# Patient Record
Sex: Female | Born: 1960 | Race: White | Hispanic: No | Smoking: Current every day smoker
Health system: Southern US, Community
[De-identification: ages and names within clinical notes are randomized; demographics above are authoritative.]

## PROBLEM LIST (undated history)

## (undated) DIAGNOSIS — I10 Essential (primary) hypertension: Secondary | ICD-10-CM

## (undated) DIAGNOSIS — E119 Type 2 diabetes mellitus without complications: Secondary | ICD-10-CM

## (undated) DIAGNOSIS — K219 Gastro-esophageal reflux disease without esophagitis: Secondary | ICD-10-CM

## (undated) DIAGNOSIS — E079 Disorder of thyroid, unspecified: Secondary | ICD-10-CM

## (undated) HISTORY — DX: Gastro-esophageal reflux disease without esophagitis: K21.9

## (undated) HISTORY — DX: Essential (primary) hypertension: I10

## (undated) HISTORY — PX: TUBAL LIGATION: SHX77

## (undated) HISTORY — PX: TONSILLECTOMY AND ADENOIDECTOMY: SUR1326

## (undated) HISTORY — PX: ABLATION: SHX5711

## (undated) HISTORY — DX: Disorder of thyroid, unspecified: E07.9

---

## 1998-03-17 ENCOUNTER — Emergency Department (HOSPITAL_COMMUNITY): Admission: EM | Admit: 1998-03-17 | Discharge: 1998-03-17 | Payer: Self-pay | Admitting: Emergency Medicine

## 1998-03-18 ENCOUNTER — Encounter: Admission: RE | Admit: 1998-03-18 | Discharge: 1998-03-18 | Payer: Self-pay | Admitting: *Deleted

## 1999-10-22 ENCOUNTER — Emergency Department (HOSPITAL_COMMUNITY): Admission: EM | Admit: 1999-10-22 | Discharge: 1999-10-22 | Payer: Self-pay | Admitting: Emergency Medicine

## 1999-10-22 ENCOUNTER — Encounter: Payer: Self-pay | Admitting: Emergency Medicine

## 2000-02-22 ENCOUNTER — Other Ambulatory Visit: Admission: RE | Admit: 2000-02-22 | Discharge: 2000-02-22 | Payer: Self-pay | Admitting: Obstetrics and Gynecology

## 2000-09-24 ENCOUNTER — Ambulatory Visit (HOSPITAL_COMMUNITY): Admission: RE | Admit: 2000-09-24 | Discharge: 2000-09-24 | Payer: Self-pay | Admitting: Family Medicine

## 2000-09-24 ENCOUNTER — Encounter: Payer: Self-pay | Admitting: Family Medicine

## 2001-03-25 ENCOUNTER — Other Ambulatory Visit: Admission: RE | Admit: 2001-03-25 | Discharge: 2001-03-25 | Payer: Self-pay | Admitting: Obstetrics and Gynecology

## 2002-07-21 ENCOUNTER — Other Ambulatory Visit: Admission: RE | Admit: 2002-07-21 | Discharge: 2002-07-21 | Payer: Self-pay | Admitting: Obstetrics and Gynecology

## 2005-05-29 ENCOUNTER — Ambulatory Visit: Payer: Self-pay | Admitting: Oncology

## 2005-07-03 ENCOUNTER — Encounter (HOSPITAL_COMMUNITY): Admission: RE | Admit: 2005-07-03 | Discharge: 2005-07-04 | Payer: Self-pay | Admitting: Endocrinology

## 2005-07-06 ENCOUNTER — Ambulatory Visit (HOSPITAL_COMMUNITY): Admission: RE | Admit: 2005-07-06 | Discharge: 2005-07-06 | Payer: Self-pay | Admitting: Oncology

## 2005-07-13 ENCOUNTER — Ambulatory Visit (HOSPITAL_COMMUNITY): Admission: RE | Admit: 2005-07-13 | Discharge: 2005-07-13 | Payer: Self-pay | Admitting: Endocrinology

## 2005-07-27 ENCOUNTER — Ambulatory Visit (HOSPITAL_COMMUNITY): Admission: RE | Admit: 2005-07-27 | Discharge: 2005-07-27 | Payer: Self-pay | Admitting: Endocrinology

## 2005-08-01 ENCOUNTER — Encounter: Admission: RE | Admit: 2005-08-01 | Discharge: 2005-08-01 | Payer: Self-pay | Admitting: Obstetrics and Gynecology

## 2005-08-10 ENCOUNTER — Ambulatory Visit: Payer: Self-pay | Admitting: Oncology

## 2005-11-06 ENCOUNTER — Ambulatory Visit: Payer: Self-pay | Admitting: Oncology

## 2006-01-14 ENCOUNTER — Encounter: Admission: RE | Admit: 2006-01-14 | Discharge: 2006-01-14 | Payer: Self-pay | Admitting: Endocrinology

## 2006-01-18 ENCOUNTER — Encounter: Admission: RE | Admit: 2006-01-18 | Discharge: 2006-01-18 | Payer: Self-pay | Admitting: Endocrinology

## 2007-10-20 ENCOUNTER — Encounter: Admission: RE | Admit: 2007-10-20 | Discharge: 2007-10-20 | Payer: Self-pay | Admitting: Obstetrics and Gynecology

## 2008-10-20 ENCOUNTER — Encounter: Admission: RE | Admit: 2008-10-20 | Discharge: 2008-10-20 | Payer: Self-pay | Admitting: Obstetrics and Gynecology

## 2009-10-21 ENCOUNTER — Encounter: Admission: RE | Admit: 2009-10-21 | Discharge: 2009-10-21 | Payer: Self-pay | Admitting: Obstetrics and Gynecology

## 2009-11-18 ENCOUNTER — Ambulatory Visit (HOSPITAL_COMMUNITY): Admission: RE | Admit: 2009-11-18 | Discharge: 2009-11-18 | Payer: Self-pay | Admitting: Obstetrics and Gynecology

## 2010-08-28 LAB — BASIC METABOLIC PANEL
BUN: 6 mg/dL (ref 6–23)
Calcium: 9 mg/dL (ref 8.4–10.5)
GFR calc non Af Amer: >60 mL/min (ref >60)

## 2010-08-28 LAB — CBC
HCT: 36.8 % (ref 36.0–46.0)
Hemoglobin: 12.4 g/dL (ref 12.0–15.0)
MCHC: 33.5 g/dL (ref 30.0–36.0)
MCV: 86.6 fL (ref 78.0–100.0)
Platelets: 545 10*3/uL — ABNORMAL HIGH (ref 150–400)
RBC: 4.26 MIL/uL (ref 3.87–5.11)
RDW: 16.3 % — ABNORMAL HIGH (ref 11.5–15.5)

## 2010-10-10 ENCOUNTER — Other Ambulatory Visit: Payer: Self-pay | Admitting: Obstetrics and Gynecology

## 2010-10-10 DIAGNOSIS — Z1231 Encounter for screening mammogram for malignant neoplasm of breast: Secondary | ICD-10-CM

## 2010-10-25 ENCOUNTER — Ambulatory Visit: Payer: Self-pay

## 2010-10-30 ENCOUNTER — Ambulatory Visit
Admission: RE | Admit: 2010-10-30 | Discharge: 2010-10-30 | Disposition: A | Discharge status: Home / Self Care | Payer: 59 | Source: Ambulatory Visit | Attending: Obstetrics and Gynecology | Admitting: Obstetrics and Gynecology

## 2010-10-30 DIAGNOSIS — Z1231 Encounter for screening mammogram for malignant neoplasm of breast: Secondary | ICD-10-CM

## 2010-11-09 ENCOUNTER — Other Ambulatory Visit: Payer: Self-pay | Admitting: Obstetrics and Gynecology

## 2010-11-13 ENCOUNTER — Other Ambulatory Visit: Payer: Self-pay | Admitting: Gastroenterology

## 2011-11-15 ENCOUNTER — Other Ambulatory Visit: Payer: Self-pay | Admitting: Obstetrics and Gynecology

## 2011-11-15 DIAGNOSIS — Z1231 Encounter for screening mammogram for malignant neoplasm of breast: Secondary | ICD-10-CM

## 2011-11-19 ENCOUNTER — Ambulatory Visit
Admission: RE | Admit: 2011-11-19 | Discharge: 2011-11-19 | Disposition: A | Discharge status: Home / Self Care | Payer: 59 | Source: Ambulatory Visit | Attending: Obstetrics and Gynecology | Admitting: Obstetrics and Gynecology

## 2011-11-19 DIAGNOSIS — Z1231 Encounter for screening mammogram for malignant neoplasm of breast: Secondary | ICD-10-CM

## 2012-03-14 ENCOUNTER — Telehealth: Payer: Self-pay | Admitting: *Deleted

## 2012-03-17 NOTE — Telephone Encounter (Signed)
No note

## 2012-10-13 ENCOUNTER — Other Ambulatory Visit: Payer: Self-pay

## 2012-10-13 DIAGNOSIS — Z1231 Encounter for screening mammogram for malignant neoplasm of breast: Secondary | ICD-10-CM

## 2012-11-17 ENCOUNTER — Ambulatory Visit (INDEPENDENT_AMBULATORY_CARE_PROVIDER_SITE_OTHER): Payer: BC Managed Care – PPO | Admitting: Physician Assistant

## 2012-11-17 VITALS — BP 144/84 | HR 93 | Temp 98.2°F | Resp 16 | Ht 62.0 in | Wt 204.2 lb

## 2012-11-17 DIAGNOSIS — R059 Cough, unspecified: Secondary | ICD-10-CM

## 2012-11-17 DIAGNOSIS — J019 Acute sinusitis, unspecified: Secondary | ICD-10-CM

## 2012-11-17 DIAGNOSIS — R05 Cough: Secondary | ICD-10-CM

## 2012-11-17 DIAGNOSIS — H6590 Unspecified nonsuppurative otitis media, unspecified ear: Secondary | ICD-10-CM

## 2012-11-17 MED ORDER — AMOXICILLIN-POT CLAVULANATE 875-125 MG PO TABS: 1.0000 | ORAL_TABLET | Freq: Two times a day (BID) | ORAL | Status: DC

## 2012-11-17 MED ORDER — HYDROCODONE-HOMATROPINE 5-1.5 MG/5ML PO SYRP: ORAL_SOLUTION | ORAL | Status: DC

## 2012-11-17 MED ORDER — IPRATROPIUM BROMIDE 0.06 % NA SOLN: 2.0000 | Freq: Three times a day (TID) | NASAL | Status: DC

## 2012-11-17 MED ORDER — ALBUTEROL SULFATE HFA 108 (90 BASE) MCG/ACT IN AERS: 2.0000 | INHALATION_SPRAY | RESPIRATORY_TRACT | Status: DC | PRN

## 2012-11-17 NOTE — Progress Notes (Signed)
Patient ID: JASEY CORTEZ MRN: [id number], DOB: [birth year], 52 y.o. Date of Encounter: 11/17/2012, 9:27 AM  Primary Physician: Lolita Patella, MD  Chief Complaint:  Chief Complaint  Patient presents with  . Otalgia    both ears, feels like fluid behind ears  . Cough  . Sore Throat    HPI: 52 y.o. female presents with 3-4 day history of nasal congestion, post nasal drip, sore throat, sinus pressure, and cough. Subjective fever and chills. Nasal congestion thick and green/yellow. Sinus pressure is the worst symptom. Cough is productive secondary to post nasal drip and not associated and worse when she lays down. Some wheezing. No SOB, chest pain or chest tightness. Ears feel full, leading to sensation of muffled hearing. Has tried OTC cold preps without success. No GI complaints. Appetite decreased. Pushing fluids. Some sick contacts at work. No recent antibiotics or recent travels.   No leg trauma, sedentary periods, or h/o cancer.  Past Medical History  Diagnosis Date  . Thyroid disease   . Hypertension   . GERD (gastroesophageal reflux disease)      Home Meds: Prior to Admission medications   Medication Sig Start Date End Date Taking? Authorizing Provider  amLODipine-benazepril (LOTREL) 10-20 MG per capsule Take 1 capsule by mouth daily.   Yes Historical Provider, MD  hydrochlorothiazide (HYDRODIURIL) 25 MG tablet Take 25 mg by mouth daily.   Yes Historical Provider, MD  levothyroxine (SYNTHROID, LEVOTHROID) 137 MCG tablet Take 137 mcg by mouth daily before breakfast.   Yes Historical Provider, MD  pantoprazole (PROTONIX) 40 MG tablet Take 40 mg by mouth daily.   Yes Historical Provider, MD  tolterodine (DETROL LA) 4 MG 24 hr capsule Take 4 mg by mouth daily.   Yes Historical Provider, MD  zolpidem (AMBIEN) 10 MG tablet Take 10 mg by mouth at bedtime as needed for sleep.   Yes Historical Provider, MD                                Allergies:  Allergies    Allergen Reactions  . Sulfa Antibiotics Anaphylaxis    History   Social History  . Marital Status: Legally Separated    Spouse Name: N/A    Number of Children: N/A  . Years of Education: N/A   Occupational History  . Not on file.   Social History Main Topics  . Smoking status: Current Every Day Smoker -- 1.00 packs/day  . Smokeless tobacco: Not on file  . Alcohol Use: No  . Drug Use: No  . Sexually Active: Not on file   Other Topics Concern  . Not on file   Social History Narrative  . No narrative on file     Review of Systems: Constitutional: Positive for fatigue, fever, and chills. Negative for night sweats or weight changes HEENT: see above Cardiovascular: negative for chest pain or palpitations Respiratory: Positive for cough and wheezing. Negative for hemoptysis, or shortness of breath Abdominal: negative for abdominal pain, nausea, vomiting or diarrhea Dermatological: negative for rash Neurologic: Positive for headache. Negative for dizziness or vertigo   Physical Exam: Blood pressure 144/84, pulse 93, temperature 98.2 F (36.8 C), temperature source Oral, resp. rate 16, height 5\' 2"  (1.575 m), weight 204 lb 3.2 oz (92.625 kg)., Body mass index is 37.34 kg/(m^2). General: Well developed, well nourished, in no acute distress. Head: Normocephalic, atraumatic, eyes without discharge, sclera non-icteric, nares are congested.  Bilateral auditory canals clear, TM's are without perforation, pearly grey with reflective cone of light bilaterally. Serous effusion bilaterally behind TM's. Maxillary sinus TTP. Oral cavity moist, dentition normal. Posterior pharynx with post nasal drip and mild erythema. No peritonsillar abscess or tonsillar exudate. Neck: Supple. No thyromegaly. Full ROM. No lymphadenopathy. Lungs: Clear bilaterally to auscultation without wheezes, rales, or rhonchi. Breathing is unlabored.  Heart: RRR with S1 S2. No murmurs, rubs, or gallops  appreciated. Msk:  Strength and tone normal for age. Extremities: No clubbing or cyanosis. No edema. Neuro: Alert and oriented X 3. Moves all extremities spontaneously. CNII-XII grossly in tact. Psych:  Responds to questions appropriately with a normal affect.     ASSESSMENT AND PLAN:  52 y.o. female with sinusitis, serous otitis, and cough -Augmentin 875/125 mg 1 po bid #20 no RF -Atrovent NS 0.06% 2 sprays each nare bid prn #1 no RF -Hycodan #4oz 1 tsp po q 4-6 hours prn cough no RF SED -Proventil 2 puffs inhaled q 4-6 hours prn #1 RF 1 -Mucinex -Tylenol/Motrin prn -Rest/fluids -RTC precautions -RTC 3-5 days if no improvement  Signed, Eula Listen, PA-C 11/17/2012 9:27 AM

## 2012-11-24 ENCOUNTER — Ambulatory Visit: Payer: 59

## 2012-11-25 ENCOUNTER — Ambulatory Visit
Admission: RE | Admit: 2012-11-25 | Discharge: 2012-11-25 | Disposition: A | Discharge status: Home / Self Care | Payer: BC Managed Care – PPO | Source: Ambulatory Visit

## 2012-11-25 DIAGNOSIS — Z1231 Encounter for screening mammogram for malignant neoplasm of breast: Secondary | ICD-10-CM

## 2013-05-29 ENCOUNTER — Encounter (HOSPITAL_COMMUNITY): Payer: Self-pay | Admitting: Emergency Medicine

## 2013-05-29 ENCOUNTER — Emergency Department (HOSPITAL_COMMUNITY)
Admission: EM | Admit: 2013-05-29 | Discharge: 2013-05-29 | Disposition: A | Discharge status: Home / Self Care | Payer: BC Managed Care – PPO | Attending: Emergency Medicine | Admitting: Emergency Medicine

## 2013-05-29 DIAGNOSIS — R112 Nausea with vomiting, unspecified: Secondary | ICD-10-CM

## 2013-05-29 DIAGNOSIS — R109 Unspecified abdominal pain: Secondary | ICD-10-CM

## 2013-05-29 DIAGNOSIS — K219 Gastro-esophageal reflux disease without esophagitis: Secondary | ICD-10-CM | POA: Insufficient documentation

## 2013-05-29 DIAGNOSIS — R197 Diarrhea, unspecified: Secondary | ICD-10-CM

## 2013-05-29 DIAGNOSIS — R000 Tachycardia, unspecified: Secondary | ICD-10-CM | POA: Insufficient documentation

## 2013-05-29 DIAGNOSIS — R5381 Other malaise: Secondary | ICD-10-CM | POA: Insufficient documentation

## 2013-05-29 DIAGNOSIS — Z79899 Other long term (current) drug therapy: Secondary | ICD-10-CM | POA: Insufficient documentation

## 2013-05-29 DIAGNOSIS — R1084 Generalized abdominal pain: Secondary | ICD-10-CM | POA: Insufficient documentation

## 2013-05-29 DIAGNOSIS — E0590 Thyrotoxicosis, unspecified without thyrotoxic crisis or storm: Secondary | ICD-10-CM | POA: Insufficient documentation

## 2013-05-29 DIAGNOSIS — I10 Essential (primary) hypertension: Secondary | ICD-10-CM | POA: Insufficient documentation

## 2013-05-29 DIAGNOSIS — E669 Obesity, unspecified: Secondary | ICD-10-CM | POA: Insufficient documentation

## 2013-05-29 DIAGNOSIS — F17200 Nicotine dependence, unspecified, uncomplicated: Secondary | ICD-10-CM | POA: Insufficient documentation

## 2013-05-29 DIAGNOSIS — R630 Anorexia: Secondary | ICD-10-CM | POA: Insufficient documentation

## 2013-05-29 LAB — CBC WITH DIFFERENTIAL/PLATELET
Eosinophils Relative: 1 % (ref 0–5)
HCT: 38.3 % (ref 36.0–46.0)
Hemoglobin: 13.2 g/dL (ref 12.0–15.0)
MCH: 30 pg (ref 26.0–34.0)
MCHC: 34.5 g/dL (ref 30.0–36.0)
MCV: 87 fL (ref 78.0–100.0)
Monocytes Absolute: 0.4 10*3/uL (ref 0.1–1.0)
Monocytes Relative: 2 % — ABNORMAL LOW (ref 3–12)
Neutrophils Relative %: 91 % — ABNORMAL HIGH (ref 43–77)

## 2013-05-29 LAB — COMPREHENSIVE METABOLIC PANEL
AST: 17 U/L (ref 0–37)
Alkaline Phosphatase: 87 U/L (ref 39–117)
BUN: 9 mg/dL (ref 6–23)
Calcium: 8.5 mg/dL (ref 8.4–10.5)
Creatinine, Ser: 0.65 mg/dL (ref 0.50–1.10)
GFR calc Af Amer: >90 mL/min (ref >90)
GFR calc non Af Amer: >90 mL/min (ref >90)
Potassium: 3.7 mEq/L (ref 3.5–5.1)
Sodium: 134 mEq/L — ABNORMAL LOW (ref 135–145)

## 2013-05-29 LAB — LIPASE, BLOOD: Lipase: 19 U/L (ref 11–59)

## 2013-05-29 LAB — URINALYSIS, ROUTINE W REFLEX MICROSCOPIC
Bilirubin Urine: NEGATIVE
Glucose, UA: NEGATIVE mg/dL
Hgb urine dipstick: NEGATIVE
Leukocytes, UA: NEGATIVE
Protein, ur: NEGATIVE mg/dL
Urobilinogen, UA: 0.2 mg/dL (ref 0.0–1.0)

## 2013-05-29 MED ORDER — PROMETHAZINE HCL 25 MG PO TABS: 25.0000 mg | ORAL_TABLET | Freq: Four times a day (QID) | ORAL | Status: AC | PRN

## 2013-05-29 MED ORDER — SODIUM CHLORIDE 0.9 % IV BOLUS (SEPSIS)
1000.0000 mL | Freq: Once | INTRAVENOUS | Status: AC
  Administered 2013-05-29: 1000 mL via INTRAVENOUS

## 2013-05-29 MED ORDER — HYDROCODONE-ACETAMINOPHEN 5-325 MG PO TABS: 1.0000 | ORAL_TABLET | ORAL | Status: AC | PRN

## 2013-05-29 MED ORDER — DIPHENOXYLATE-ATROPINE 2.5-0.025 MG PO TABS: 1.0000 | ORAL_TABLET | Freq: Four times a day (QID) | ORAL | Status: AC | PRN

## 2013-05-29 NOTE — ED Notes (Signed)
Pt c/o lower abd cramping and upper abd pain; pt sts diarrhea x 3 weeks worse today and N/V starting today

## 2013-05-29 NOTE — ED Notes (Signed)
PA at bedside.

## 2013-05-29 NOTE — ED Provider Notes (Signed)
CSN: [id number]     Arrival date & time 05/29/13  1242 History   First MD Initiated Contact with Patient 05/29/13 1502     Chief Complaint  Patient presents with  . Abdominal Pain  . Emesis  . Diarrhea   (Consider location/radiation/quality/duration/timing/severity/associated sxs/prior Treatment) Patient is a 52 y.o. female presenting with abdominal pain, vomiting, and diarrhea.  Abdominal Pain Associated symptoms: diarrhea, nausea and vomiting   Associated symptoms: no chest pain, no chills, no cough, no dysuria, no fever, no hematuria, no shortness of breath and no sore throat   Emesis Associated symptoms: abdominal pain and diarrhea   Associated symptoms: no arthralgias, no chills, no myalgias and no sore throat   Diarrhea Associated symptoms: abdominal pain and vomiting   Associated symptoms: no arthralgias, no chills, no fever and no myalgias    A this is a 52 year old female presents the emergency department chief complaint of nausea, vomiting, diarrhea and abdominal pain.  Patient states she has had loose stools for the past month which appeared new.  She states that this morning at 3 AM she woke up with severe abdominal cramping, diffuse watery diarrhea, vomiting nonbilious nonbloody vomitus.  Patient states she had innumerable episodes of both vomiting and diarrhea until approximately 3 AM.  She describes her abdominal pain as diffuse, cramping.  No radiating pain.  The patient complains of weakness at this time.  She denies fevers, previous surgeries to the abdomen, recent travel, ingestion of suspect foods, or contacts with similar symptoms.  Patient states she had one episode about 3 years ago of prolonged loose frequent stools.  She was followed with GI had stool cultures and endoscopies which only showed polyps.  All other results are negative. Patient has a past medical history of hyperthyroid thumb with thyroid irradiation.  She is currently on for quite medication.  She states  her TSH levels have been normal. She is a current daily smoker.  Past Medical History  Diagnosis Date  . Thyroid disease   . Hypertension   . GERD (gastroesophageal reflux disease)    History reviewed. No pertinent past surgical history. Family History  Problem Relation Age of Onset  . Cancer Mother    History  Substance Use Topics  . Smoking status: Current Every Day Smoker -- 1.00 packs/day  . Smokeless tobacco: Not on file  . Alcohol Use: No   OB History   Grav Para Term Preterm Abortions TAB SAB Ect Mult Living                 Review of Systems  Constitutional: Positive for appetite change. Negative for fever and chills.  HENT: Negative for sore throat.   Eyes: Negative for photophobia.  Respiratory: Negative for cough, choking, chest tightness and shortness of breath.   Cardiovascular: Negative for chest pain.  Gastrointestinal: Positive for nausea, vomiting, abdominal pain and diarrhea. Negative for blood in stool and abdominal distention.  Endocrine: Negative for polydipsia, polyphagia and polyuria.  Genitourinary: Negative for dysuria, urgency, frequency and hematuria.  Musculoskeletal: Negative for arthralgias and myalgias.  Skin: Negative for rash.  Neurological: Positive for weakness. Negative for syncope and light-headedness.  Psychiatric/Behavioral: Negative for confusion.    Allergies  Sulfa antibiotics  Home Medications   Current Outpatient Rx  Name  Route  Sig  Dispense  Refill  . amLODipine-benazepril (LOTREL) 10-20 MG per capsule   Oral   Take 1 capsule by mouth daily.         Marland Kitchen  cholestyramine (QUESTRAN) 4 GM/DOSE powder   Oral   Take 4 g by mouth 3 (three) times daily with meals.         . hydrochlorothiazide (HYDRODIURIL) 25 MG tablet   Oral   Take 25 mg by mouth daily.         Marland Kitchen levothyroxine (SYNTHROID, LEVOTHROID) 137 MCG tablet   Oral   Take 137 mcg by mouth daily before breakfast.         . pantoprazole (PROTONIX) 40 MG  tablet   Oral   Take 40 mg by mouth daily.         Marland Kitchen tolterodine (DETROL LA) 4 MG 24 hr capsule   Oral   Take 4 mg by mouth daily.         Marland Kitchen zolpidem (AMBIEN) 10 MG tablet   Oral   Take 10 mg by mouth at bedtime as needed for sleep.          BP 132/65  Pulse 110  Temp(Src) 98.5 F (36.9 C) (Oral)  Resp 24  Ht 5\' 1"  (1.549 m)  Wt 203 lb 4.8 oz (92.216 kg)  BMI 38.43 kg/m2  SpO2 100% Physical Exam  Nursing note and vitals reviewed. Constitutional: She appears well-developed and well-nourished. No distress.  Obese abdomen  HENT:  Head: Normocephalic and atraumatic.  Mouth/Throat: No oropharyngeal exudate.  Dry mucous membranes  Eyes: Conjunctivae are normal.  Neck: Normal range of motion. No thyromegaly present.  Cardiovascular: Regular rhythm and normal heart sounds.   tachycardic  Pulmonary/Chest: Effort normal and breath sounds normal. She has no wheezes. She exhibits no tenderness.  Abdominal: Soft. She exhibits no distension. There is no tenderness. There is no rebound and no guarding.  Hyperactive bowel sounds  Musculoskeletal: Normal range of motion.  Lymphadenopathy:    She has no cervical adenopathy.  Neurological: She is alert.  Skin: Skin is warm and dry. No rash noted.  Psychiatric: Her behavior is normal.    ED Course  Procedures (including critical care time) Labs Review Labs Reviewed  CBC WITH DIFFERENTIAL - Abnormal; Notable for the following:    WBC 18.7 (*)    Platelets 553 (*)    Neutrophils Relative % 91 (*)    Neutro Abs 17.0 (*)    Lymphocytes Relative 6 (*)    Monocytes Relative 2 (*)    All other components within normal limits  COMPREHENSIVE METABOLIC PANEL - Abnormal; Notable for the following:    Sodium 134 (*)    Glucose, Bld 174 (*)    All other components within normal limits  LIPASE, BLOOD  URINALYSIS, ROUTINE W REFLEX MICROSCOPIC   Imaging Review No results found.  EKG Interpretation   None       MDM   1.  Abdominal pain   2. Nausea and vomiting in adult   3. Diarrhea    3:21 PM BP 116/59  Pulse 105  Temp(Src) 98.5 F (36.9 C) (Oral)  Resp 24  Ht 5\' 1"  (1.549 m)  Wt 203 lb 4.8 oz (92.216 kg)  BMI 38.43 kg/m2  SpO2 100% Patient with N/V/D and tenesmus.  Elevated white count and left shift. Hyperglycemia with with gap of 13. I do not believe this represents anything sig. Such as DKA no history of diabetes.  The recent last episode of vomiting was 8 AM this morning.  Her last episode of diarrhea was approximately 4 hours ago.  The patient has no nausea at this time.  She  feels weak but no abdominal pain.  No repeat episodes of nausea, vomiting or diarrhea here in the ED.  Normal orthostatic vital signs.  I do not believe that her symptoms represent acute intra-abdominal pathology. Do not suspect ACS.     5:46 PM BP 122/73  Pulse 95  Temp(Src) 98.5 F (36.9 C) (Oral)  Resp 18  Ht 5\' 1"  (1.549 m)  Wt 203 lb 4.8 oz (92.216 kg)  BMI 38.43 kg/m2  SpO2 100% The patient pulse rate has improved.  Although she has elevated white count and tachycardia and onset do not believe this represents a surgery sepsis.  Feel is secondary to her dehydration.  The patient states she is feeling much better.  She is to contact a 32 ounce of Gatorade bottle while here.  No abdominal pain, no nausea, vomiting or repeat episodes of diarrhea.  I've discussed with the patient that with an elevated white count she may have developing intra-abdominal pathology which was not diagnosed as we did not perform CT scan today.  Patient states she is feeling better and does not wish to have a CT scan at this time.  She does understand she is to followup at the emergency department should her abdominal pain localized or worsen.  She should also return if she is unable to hold down foods or fluids.  The patient seems competent to understand the differential.  She expresses understanding of the plan and will return for discussed  reasons. The patient appears reasonably screened and/or stabilized for discharge and I doubt any other medical condition or other [facility] requiring further screening, evaluation, or treatment in the ED at this time prior to discharge.     Arthor Captain, PA-C 05/29/13 1749

## 2013-05-29 NOTE — ED Notes (Signed)
Pharmacy tech at bedside 

## 2013-05-29 NOTE — ED Provider Notes (Signed)
Medical screening examination/treatment/procedure(s) were performed by non-physician practitioner and as supervising physician I was immediately available for consultation/collaboration.  EKG Interpretation   None         Talesha Ellithorpe N Alano Blasco, DO 05/29/13 2332 

## 2013-05-29 NOTE — ED Notes (Signed)
Pt c/o diarrhea X 1 month, sts last night it was worse, very watery and vomited. sts now it just feels none stop. Has been able to keep Gatorade down today. Pt c/o bad gas. And upper abd pain, has been going on for a while too but last night the pain increased. Denies taking pain meds at home. Denies having fever at home. Reports she just hasn't had much energy the past couple days. Denies abd tenderness. Pt sts she does feel like she is bloated. Nad, skin warm and dry, resp e/u.

## 2013-05-29 NOTE — ED Notes (Signed)
Pt's fluids are halfway finished.

## 2013-05-29 NOTE — ED Notes (Signed)
Pt ambulated to bathroom to provide urine sample

## 2013-10-17 ENCOUNTER — Ambulatory Visit: Payer: BC Managed Care – PPO

## 2013-10-17 ENCOUNTER — Ambulatory Visit (INDEPENDENT_AMBULATORY_CARE_PROVIDER_SITE_OTHER): Payer: BC Managed Care – PPO | Admitting: Family Medicine

## 2013-10-17 VITALS — BP 142/80 | HR 87 | Temp 98.2°F | Resp 16 | Ht 61.0 in | Wt 191.0 lb

## 2013-10-17 DIAGNOSIS — R197 Diarrhea, unspecified: Secondary | ICD-10-CM

## 2013-10-17 DIAGNOSIS — R109 Unspecified abdominal pain: Secondary | ICD-10-CM

## 2013-10-17 DIAGNOSIS — K5289 Other specified noninfective gastroenteritis and colitis: Secondary | ICD-10-CM

## 2013-10-17 DIAGNOSIS — K649 Unspecified hemorrhoids: Secondary | ICD-10-CM

## 2013-10-17 DIAGNOSIS — R112 Nausea with vomiting, unspecified: Secondary | ICD-10-CM

## 2013-10-17 DIAGNOSIS — K529 Noninfective gastroenteritis and colitis, unspecified: Secondary | ICD-10-CM

## 2013-10-17 LAB — POCT URINALYSIS DIPSTICK
Bilirubin, UA: NEGATIVE
Glucose, UA: NEGATIVE
Ketones, UA: NEGATIVE
Leukocytes, UA: NEGATIVE
Nitrite, UA: NEGATIVE
Protein, UA: NEGATIVE
Spec Grav, UA: 1.01
Urobilinogen, UA: 0.2
pH, UA: 6

## 2013-10-17 LAB — COMPREHENSIVE METABOLIC PANEL
ALT: 11 U/L (ref 0–35)
BUN: 10 mg/dL (ref 6–23)
CO2: 26 mEq/L (ref 19–32)
Calcium: 9.8 mg/dL (ref 8.4–10.5)
Creat: 0.74 mg/dL (ref 0.50–1.10)
Glucose, Bld: 126 mg/dL — ABNORMAL HIGH (ref 70–99)
Sodium: 130 mEq/L — ABNORMAL LOW (ref 135–145)
Total Bilirubin: 0.3 mg/dL (ref 0.2–1.2)

## 2013-10-17 LAB — POCT SEDIMENTATION RATE: POCT SED RATE: 36 mm/hr — AB (ref 0–22)

## 2013-10-17 LAB — POCT CBC
Granulocyte percent: 81 % — AB (ref 37–80)
HCT, POC: 38.6 % (ref 37.7–47.9)
Hemoglobin: 12.4 g/dL (ref 12.2–16.2)
Lymph, poc: 2.2 (ref 0.6–3.4)
MCH, POC: 29.1 pg (ref 27–31.2)
MCHC: 32.1 g/dL (ref 31.8–35.4)
MCV: 90.5 fL (ref 80–97)
MID (cbc): 0.6 (ref 0–0.9)
MPV: 8.5 fL (ref 0–99.8)
POC Granulocyte: 11.9 — AB (ref 2–6.9)
POC LYMPH PERCENT: 15.1 %L (ref 10–50)
POC MID %: 3.9 % (ref 0–12)
Platelet Count, POC: 624 10*3/uL — AB (ref 142–424)
RBC: 4.26 M/uL (ref 4.04–5.48)
RDW, POC: 14.7 %
WBC: 14.7 10*3/uL — AB (ref 4.6–10.2)

## 2013-10-17 LAB — POCT UA - MICROSCOPIC ONLY
Casts, Ur, LPF, POC: NEGATIVE
Crystals, Ur, HPF, POC: NEGATIVE
Yeast, UA: NEGATIVE

## 2013-10-17 LAB — COMPREHENSIVE METABOLIC PANEL WITH GFR
AST: 14 U/L (ref 0–37)
Albumin: 4.3 g/dL (ref 3.5–5.2)
Alkaline Phosphatase: 68 U/L (ref 39–117)
Chloride: 93 meq/L — ABNORMAL LOW (ref 96–112)
Potassium: 4 meq/L (ref 3.5–5.3)
Total Protein: 7.2 g/dL (ref 6.0–8.3)

## 2013-10-17 LAB — GLUCOSE, POCT (MANUAL RESULT ENTRY): POC Glucose: 130 mg/dl — AB (ref 70–99)

## 2013-10-17 MED ORDER — LIDOCAINE-PRILOCAINE 2.5-2.5 % EX CREA: 1.0000 "application " | TOPICAL_CREAM | CUTANEOUS | Status: AC | PRN

## 2013-10-17 MED ORDER — DILTIAZEM GEL 2 %: CUTANEOUS | Status: AC

## 2013-10-17 MED ORDER — HYDROCORTISONE ACETATE 25 MG RE SUPP: 25.0000 mg | Freq: Two times a day (BID) | RECTAL | Status: AC

## 2013-10-17 MED ORDER — ONDANSETRON 4 MG PO TBDP: 4.0000 mg | ORAL_TABLET | Freq: Three times a day (TID) | ORAL | Status: AC | PRN

## 2013-10-17 NOTE — Patient Instructions (Addendum)
Diet for Diarrhea, Adult Frequent, runny stools (diarrhea) may be caused or worsened by food or drink. Diarrhea may be relieved by changing your diet. Since diarrhea can last up to 7 days, it is easy for you to lose too much fluid from the body and become dehydrated. Fluids that are lost need to be replaced. Along with a modified diet, make sure you drink enough fluids to keep your urine clear or pale yellow. DIET INSTRUCTIONS  Ensure adequate fluid intake (hydration): have 1 cup (8 oz) of fluid for each diarrhea episode. Avoid fluids that contain simple sugars or sports drinks, fruit juices, whole milk products, and sodas. Your urine should be clear or pale yellow if you are drinking enough fluids. Hydrate with an oral rehydration solution that you can purchase at pharmacies, retail stores, and online. You can prepare an oral rehydration solution at home by mixing the following ingredients together:    tsp table salt.   tsp baking soda.   tsp salt substitute containing potassium chloride.  1  tablespoons sugar.  1 L (34 oz) of water.  Certain foods and beverages may increase the speed at which food moves through the gastrointestinal (GI) tract. These foods and beverages should be avoided and include:  Caffeinated and alcoholic beverages.  High-fiber foods, such as raw fruits and vegetables, nuts, seeds, and whole grain breads and cereals.  Foods and beverages sweetened with sugar alcohols, such as xylitol, sorbitol, and mannitol.  Some foods may be well tolerated and may help thicken stool including:  Starchy foods, such as rice, toast, pasta, low-sugar cereal, oatmeal, grits, baked potatoes, crackers, and bagels.   Bananas.   Applesauce.  Add probiotic-rich foods to help increase healthy bacteria in the GI tract, such as yogurt and fermented milk products. RECOMMENDED FOODS AND BEVERAGES Starches Choose foods with less than 2 g of fiber per serving.  Recommended:  White,  [place], and pita breads, plain rolls, buns, bagels. Plain muffins, matzo. Soda, saltine, or graham crackers. Pretzels, melba toast, zwieback. Cooked cereals made with water: cornmeal, farina, cream cereals. Dry cereals: refined corn, wheat, rice. Potatoes prepared any way without skins, refined macaroni, spaghetti, noodles, refined rice.  Avoid:  Bread, rolls, or crackers made with whole wheat, multi-grains, rye, bran seeds, nuts, or coconut. Corn tortillas or taco shells. Cereals containing whole grains, multi-grains, bran, coconut, nuts, raisins. Cooked or dry oatmeal. Coarse wheat cereals, granola. Cereals advertised as "high-fiber." Potato skins. Whole grain pasta, wild or brown rice. Popcorn. Sweet potatoes, yams. Sweet rolls, doughnuts, waffles, pancakes, sweet breads. Vegetables  Recommended: Strained tomato and vegetable juices. Most well-cooked and canned vegetables without seeds. Fresh: Tender lettuce, cucumber without the skin, cabbage, spinach, bean sprouts.  Avoid: Fresh, cooked, or canned: Artichokes, baked beans, beet greens, broccoli, Brussels sprouts, corn, kale, legumes, peas, sweet potatoes. Cooked: Green or red cabbage, spinach. Avoid large servings of any vegetables because vegetables shrink when cooked, and they contain more fiber per serving than fresh vegetables. Fruit  Recommended: Cooked or canned: Apricots, applesauce, cantaloupe, cherries, fruit cocktail, grapefruit, grapes, kiwi, mandarin oranges, peaches, pears, plums, watermelon. Fresh: Apples without skin, ripe banana, grapes, cantaloupe, cherries, grapefruit, peaches, oranges, plums. Keep servings limited to  cup or 1 piece.  Avoid: Fresh: Apples with skin, apricots, mangoes, pears, raspberries, strawberries. Prune juice, stewed or dried prunes. Dried fruits, raisins, dates. Large servings of all fresh fruits. Protein  Recommended: Ground or well-cooked tender beef, ham, veal, lamb, pork, or poultry. Eggs. Fish,  oysters, shrimp,  lobster, other seafoods. Liver, organ meats.  Avoid: Tough, fibrous meats with gristle. Peanut butter, smooth or chunky. Cheese, nuts, seeds, legumes, dried peas, beans, lentils. Dairy  Recommended: Yogurt, lactose-free milk, kefir, drinkable yogurt, buttermilk, soy milk, or plain hard cheese.  Avoid: Milk, chocolate milk, beverages made with milk, such as milkshakes. Soups  Recommended: Bouillon, broth, or soups made from allowed foods. Any strained soup.  Avoid: Soups made from vegetables that are not allowed, cream or milk-based soups. Desserts and Sweets  Recommended: Sugar-free gelatin, sugar-free frozen ice pops made without sugar alcohol.  Avoid: Plain cakes and cookies, pie made with fruit, pudding, custard, cream pie. Gelatin, fruit, ice, sherbet, frozen ice pops. Ice cream, ice milk without nuts. Plain hard candy, honey, jelly, molasses, syrup, sugar, chocolate syrup, gumdrops, marshmallows. Fats and Oils  Recommended: Limit fats to less than 8 tsp per day.  Avoid: Seeds, nuts, olives, avocados. Margarine, butter, cream, mayonnaise, salad oils, plain salad dressings. Plain gravy, crisp bacon without rind. Beverages  Recommended: Water, decaffeinated teas, oral rehydration solutions, sugar-free beverages not sweetened with sugar alcohols.  Avoid: Fruit juices, caffeinated beverages (coffee, tea, soda), alcohol, sports drinks, or lemon-lime soda. Condiments  Recommended: Ketchup, mustard, horseradish, vinegar, cocoa powder. Spices in moderation: allspice, basil, bay leaves, celery powder or leaves, cinnamon, cumin powder, curry powder, ginger, mace, marjoram, onion or garlic powder, oregano, paprika, parsley flakes, ground pepper, rosemary, sage, savory, tarragon, thyme, turmeric.  Avoid: Coconut, honey. Document Released: 08/18/2003 Document Revised: 02/20/2012 Document Reviewed: 10/12/2011 [facility] CenterExit[facility] Patient Information 2014 [place],  [place]. Hemorrhoids Hemorrhoids are swollen veins around the rectum or anus. There are two types of hemorrhoids:   Internal hemorrhoids. These occur in the veins just inside the rectum. They may poke through to the outside and become irritated and painful.  External hemorrhoids. These occur in the veins outside the anus and can be felt as a painful swelling or hard lump near the anus. CAUSES  Pregnancy.   Obesity.   Constipation or diarrhea.   Straining to have a bowel movement.   Sitting for long periods on the toilet.  Heavy lifting or other activity that caused you to strain.  Anal intercourse. SYMPTOMS   Pain.   Anal itching or irritation.   Rectal bleeding.   Fecal leakage.   Anal swelling.   One or more lumps around the anus.  DIAGNOSIS  Your caregiver may be able to diagnose hemorrhoids by visual examination. Other examinations or tests that may be performed include:   Examination of the rectal area with a gloved hand (digital rectal exam).   Examination of anal canal using a small tube (scope).   A blood test if you have lost a significant amount of blood.  A test to look inside the colon (sigmoidoscopy or colonoscopy). TREATMENT Most hemorrhoids can be treated at home. However, if symptoms do not seem to be getting better or if you have a lot of rectal bleeding, your caregiver may perform a procedure to help make the hemorrhoids get smaller or remove them completely. Possible treatments include:   Placing a rubber band at the base of the hemorrhoid to cut off the circulation (rubber band ligation).   Injecting a chemical to shrink the hemorrhoid (sclerotherapy).   Using a tool to burn the hemorrhoid (infrared light therapy).   Surgically removing the hemorrhoid (hemorrhoidectomy).   Stapling the hemorrhoid to block blood flow to the tissue (hemorrhoid stapling).  HOME CARE INSTRUCTIONS   Eat foods with fiber, such as  whole grains,  beans, nuts, fruits, and vegetables. Ask your doctor about taking products with added fiber in them (fibersupplements).  Increase fluid intake. Drink enough water and fluids to keep your urine clear or pale yellow.   Exercise regularly.   Go to the bathroom when you have the urge to have a bowel movement. Do not wait.   Avoid straining to have bowel movements.   Keep the anal area dry and clean. Use wet toilet paper or moist towelettes after a bowel movement.   Medicated creams and suppositories may be used or applied as directed.   Only take over-the-counter or prescription medicines as directed by your caregiver.   Take warm sitz baths for 15 20 minutes, 3 4 times a day to ease pain and discomfort.   Place ice packs on the hemorrhoids if they are tender and swollen. Using ice packs between sitz baths may be helpful.   Put ice in a plastic bag.   Place a towel between your skin and the bag.   Leave the ice on for 15 20 minutes, 3 4 times a day.   Do not use a donut-shaped pillow or sit on the toilet for long periods. This increases blood pooling and pain.  SEEK MEDICAL CARE IF:  You have increasing pain and swelling that is not controlled by treatment or medicine.  You have uncontrolled bleeding.  You have difficulty or you are unable to have a bowel movement.  You have pain or inflammation outside the area of the hemorrhoids. MAKE SURE YOU:  Understand these instructions.  Will watch your condition.  Will get help right away if you are not doing well or get worse. Document Released: 05/25/2000 Document Revised: 05/14/2012 Document Reviewed: 04/01/2012 [facility] LLCExitCare Patient Information 2014 [place]ExitCare, [place]LL[place].

## 2013-10-17 NOTE — Progress Notes (Signed)
 Chief Complaint:  Chief Complaint  Patient presents with  . Diarrhea    x 4 days no fever    HPI: Jacqueline Wilkerson is a 53 y.o. female who is here for  A 4 day history of non bloody diarrhea, minimal abd pain.  This is the 2nd episode she has had and lasted 2 weeks.  She does have a history of hemorrhoids, dairrhea has been constant, It is nonstop , she has been going every 15-30 mintues.  No fevers or chills, Tried eating last night a few bites of shrimp and the night before 4 tablespoons of table  4  days ago had bbq from [employer] and has before, she had this in Dec and did not go back in Dec  She is on probiotics.  Dr Dulce Sellarutlaw is her GI physician  Her colonoscopy 3 years ago was normal but had precancerous polyps, her mother has ah istory of colon cancer so has to get one every 3 years.  HAs some nausea , vomitus of what she ate, Has tried lomitil , phenergan and cholestrymaine without relief .  LActose intolerant. No history of gluten allergy. Denies UC/Crohns. Denies IBS or diverticular dz No new meds, travels    Past Medical History  Diagnosis Date  . Thyroid disease   . Hypertension   . GERD (gastroesophageal reflux disease)    No past surgical history on file. History   Social History  . Marital Status: Legally Separated    Spouse Name: N/A    Number of Children: N/A  . Years of Education: N/A   Social History Main Topics  . Smoking status: Current Every Day Smoker -- 1.00 packs/day  . Smokeless tobacco: None  . Alcohol Use: No  . Drug Use: No  . Sexual Activity: None   Other Topics Concern  . None   Social History Narrative  . None   Family History  Problem Relation Age of Onset  . Cancer Mother    Allergies  Allergen Reactions  . Sulfa Antibiotics Anaphylaxis   Prior to Admission medications   Medication Sig Start Date End Date Taking? Authorizing Provider  amLODipine-benazepril (LOTREL) 10-20 MG per capsule Take 1 capsule by mouth  daily.   Yes Historical Provider, MD  cholestyramine Lanetta Inch(QUESTRAN) 4 GM/DOSE powder Take 4 g by mouth 3 (three) times daily with meals.   Yes Historical Provider, MD  hydrochlorothiazide (HYDRODIURIL) 25 MG tablet Take 25 mg by mouth daily.   Yes Historical Provider, MD  levothyroxine (SYNTHROID, LEVOTHROID) 137 MCG tablet Take 150 mcg by mouth daily before breakfast.    Yes Historical Provider, MD  pantoprazole (PROTONIX) 40 MG tablet Take 40 mg by mouth daily.   Yes Historical Provider, MD  promethazine (PHENERGAN) 25 MG tablet Take 1 tablet (25 mg total) by mouth every 6 (six) hours as needed for nausea or vomiting. 05/29/13  Yes Arthor CaptainAbigail Harris, PA-C  tolterodine (DETROL LA) 4 MG 24 hr capsule Take 4 mg by mouth daily.   Yes Historical Provider, MD  zolpidem (AMBIEN) 10 MG tablet Take 10 mg by mouth at bedtime as needed for sleep.   Yes Historical Provider, MD  diphenoxylate-atropine (LOMOTIL) 2.5-0.025 MG per tablet Take 1-2 tablets by mouth 4 (four) times daily as needed for diarrhea or loose stools. 05/29/13   Arthor CaptainAbigail Harris, PA-C  HYDROcodone-acetaminophen (NORCO) 5-325 MG per tablet Take 1 tablet by mouth every 4 (four) hours as needed. 05/29/13   Arthor CaptainAbigail Harris,  PA-C     ROS: The patient denies fevers, chills, night sweats, unintentional weight loss, chest pain, palpitations, wheezing, dyspnea on exertion, , dysuria, hematuria, melena, numbness,  or tingling.   All other systems have been reviewed and were otherwise negative with the exception of those mentioned in the HPI and as above.    PHYSICAL EXAM: Filed Vitals:   10/17/13 1024  BP: 142/80  Pulse: 87  Temp: 98.2 F (36.8 C)  Resp: 16   Filed Vitals:   10/17/13 1024  Height: 5\' 1"  (1.549 m)  Weight: 191 lb (86.637 kg)   Body mass index is 36.11 kg/(m^2).  General: Alert, no acute distress HEENT:  Normocephalic, atraumatic, oropharynx patent. EOMI, PERRLA, + dry mucosa Cardiovascular:  Regular rate and rhythm, no rubs  murmurs or gallops.  No Carotid bruits, radial pulse intact. No pedal edema.  Respiratory: Clear to auscultation bilaterally.  No wheezes, rales, or rhonchi.  No cyanosis, no use of accessory musculature GI: No organomegaly, abdomen is soft and minimally tedner diffusely -tender, positive bowel sounds.  No masses. Skin: No rashes. Neurologic: Facial musculature symmetric. Psychiatric: Patient is appropriate throughout our interaction. Lymphatic: No cervical lymphadenopathy Musculoskeletal: Gait intact.   LABS: Results for orders placed in visit on 10/17/13  POCT CBC      Result Value Ref Range   WBC 14.7 (*) 4.6 - 10.2 K/uL   Lymph, poc 2.2  0.6 - 3.4   POC LYMPH PERCENT 15.1  10 - 50 %L   MID (cbc) 0.6  0 - 0.9   POC MID % 3.9  0 - 12 %M   POC Granulocyte 11.9 (*) 2 - 6.9   Granulocyte percent 81.0 (*) 37 - 80 %G   RBC 4.26  4.04 - 5.48 M/uL   Hemoglobin 12.4  12.2 - 16.2 g/dL   HCT, POC 16.1  09.6 - 47.9 %   MCV 90.5  80 - 97 fL   MCH, POC 29.1  27 - 31.2 pg   MCHC 32.1  31.8 - 35.4 g/dL   RDW, POC 04.5     Platelet Count, POC 624 (*) 142 - 424 K/uL   MPV 8.5  0 - 99.8 fL  GLUCOSE, POCT (MANUAL RESULT ENTRY)      Result Value Ref Range   POC Glucose 130 (*) 70 - 99 mg/dl  POCT UA - MICROSCOPIC ONLY      Result Value Ref Range   WBC, Ur, HPF, POC 0-2     RBC, urine, microscopic 1-3     Bacteria, U Microscopic trace     Mucus, UA trace     Epithelial cells, urine per micros 3-6     Crystals, Ur, HPF, POC neg     Casts, Ur, LPF, POC neg     Yeast, UA neg    POCT URINALYSIS DIPSTICK      Result Value Ref Range   Color, UA yellow     Clarity, UA clear     Glucose, UA neg     Bilirubin, UA neg     Ketones, UA neg     Spec Grav, UA 1.010     Blood, UA trace     pH, UA 6.0     Protein, UA neg     Urobilinogen, UA 0.2     Nitrite, UA neg     Leukocytes, UA Negative       EKG/XRAY:   Primary read interpreted by Dr. Conley Rolls at [facility].  Neg for any acute cardiopulmoanry  process No obstruction, free air, + nonspecific gas  ASSESSMENT/PLAN: Encounter Diagnoses  Name Primary?  . Diarrhea Yes  . Nausea and vomiting   . Abdominal cramps   . Hemorrhoids   . Gastroenteritis    53 y/o female with 4 day h/o diarrhea, this is the 2nd time, last episode lasted 2 weeks. She had colonscopy 3 yrs ago and had precancerous polyps Was given 2 L IVF in office and  she felt better BRAT diet Zofran ODT prn Diltiazem gel, anusol, and also Emla cream  For hemorrhoids Will drop off stool specimen for C diff, for OP and stool cx Refer to GI, get CT scan prn   Gross sideeffects, risk and benefits, and alternatives of medications d/w patient. Patient is aware that all medications have potential sideeffects and we are unable to predict every sideeffect or drug-drug interaction that may occur.  nell Antuhao P , DO 10/17/2013 2:04 PM

## 2013-10-18 ENCOUNTER — Other Ambulatory Visit: Payer: Self-pay | Admitting: Family Medicine

## 2013-10-19 ENCOUNTER — Other Ambulatory Visit: Payer: Self-pay | Admitting: Family Medicine

## 2013-10-19 ENCOUNTER — Telehealth: Payer: Self-pay | Admitting: Family Medicine

## 2013-10-19 ENCOUNTER — Telehealth: Payer: Self-pay

## 2013-10-19 ENCOUNTER — Telehealth: Payer: Self-pay | Admitting: *Deleted

## 2013-10-19 DIAGNOSIS — R197 Diarrhea, unspecified: Secondary | ICD-10-CM

## 2013-10-19 DIAGNOSIS — K5790 Diverticulosis of intestine, part unspecified, without perforation or abscess without bleeding: Secondary | ICD-10-CM

## 2013-10-19 DIAGNOSIS — R109 Unspecified abdominal pain: Secondary | ICD-10-CM

## 2013-10-19 LAB — C. DIFFICILE GDH AND TOXIN A/B
C. difficile GDH: NOT DETECTED
C. difficile Toxin A/B: NOT DETECTED

## 2013-10-19 NOTE — Telephone Encounter (Signed)
LM about CMP, ESR, willrefer to GI as planned, inquire how she is doing.Sodium was low due tot dehydrationa nd poor POm  but she got 2 IVF of NS  In house. F/u prn

## 2013-10-19 NOTE — Telephone Encounter (Signed)
Pt called back and she states that she is still having the diarrhea and its hard for her to be at work with this.  Labs are not back yet but pt has turned the stool samples in.

## 2013-10-19 NOTE — Telephone Encounter (Signed)
Patient left message on lab phone concerning o&p results.  Labs are not back yet.  Left message on machine for patient to call back.

## 2013-10-19 NOTE — Telephone Encounter (Signed)
Will get CT scan of abd and pelvis ruleout diverticuilitis vs IBD vs less likely appendix or GB

## 2013-10-20 ENCOUNTER — Ambulatory Visit
Admission: RE | Admit: 2013-10-20 | Discharge: 2013-10-20 | Disposition: A | Discharge status: Home / Self Care | Payer: BC Managed Care – PPO | Source: Ambulatory Visit | Attending: Family Medicine | Admitting: Family Medicine

## 2013-10-20 DIAGNOSIS — R197 Diarrhea, unspecified: Secondary | ICD-10-CM

## 2013-10-20 LAB — OVA AND PARASITE EXAMINATION: OP: NONE SEEN

## 2013-10-20 MED ORDER — IOHEXOL 300 MG/ML  SOLN: 125.0000 mL | Freq: Once | INTRAMUSCULAR | Status: AC | PRN

## 2013-10-21 ENCOUNTER — Encounter: Payer: Self-pay | Admitting: Family Medicine

## 2013-10-21 LAB — STOOL CULTURE

## 2013-10-21 MED ORDER — METRONIDAZOLE 500 MG PO TABS: 500.0000 mg | ORAL_TABLET | Freq: Two times a day (BID) | ORAL | Status: AC

## 2013-10-21 MED ORDER — CIPROFLOXACIN HCL 500 MG PO TABS: 500.0000 mg | ORAL_TABLET | Freq: Two times a day (BID) | ORAL | Status: AC

## 2013-10-21 NOTE — Telephone Encounter (Signed)
D/w pt labs and CT scan, she is 8 days now in diarrhea , similar sxs as before.  No active diverticulitis but since has diverticulosis on scan and will treat with cipro and flagyl D.w patient SEs, still waiting for c diff and gi referral

## 2013-12-31 ENCOUNTER — Other Ambulatory Visit: Payer: Self-pay

## 2013-12-31 DIAGNOSIS — Z1231 Encounter for screening mammogram for malignant neoplasm of breast: Secondary | ICD-10-CM

## 2014-01-15 ENCOUNTER — Ambulatory Visit
Admission: RE | Admit: 2014-01-15 | Discharge: 2014-01-15 | Disposition: A | Discharge status: Home / Self Care | Payer: BC Managed Care – PPO | Source: Ambulatory Visit

## 2014-01-15 DIAGNOSIS — Z1231 Encounter for screening mammogram for malignant neoplasm of breast: Secondary | ICD-10-CM

## 2015-02-10 ENCOUNTER — Other Ambulatory Visit: Payer: Self-pay

## 2015-02-10 DIAGNOSIS — Z1231 Encounter for screening mammogram for malignant neoplasm of breast: Secondary | ICD-10-CM

## 2015-02-11 ENCOUNTER — Ambulatory Visit
Admission: RE | Admit: 2015-02-11 | Discharge: 2015-02-11 | Disposition: A | Discharge status: Home / Self Care | Payer: BLUE CROSS/BLUE SHIELD | Source: Ambulatory Visit

## 2015-02-11 DIAGNOSIS — Z1231 Encounter for screening mammogram for malignant neoplasm of breast: Secondary | ICD-10-CM

## 2016-03-14 ENCOUNTER — Other Ambulatory Visit: Payer: Self-pay | Admitting: Obstetrics and Gynecology

## 2016-03-14 DIAGNOSIS — Z1231 Encounter for screening mammogram for malignant neoplasm of breast: Secondary | ICD-10-CM

## 2016-04-09 ENCOUNTER — Ambulatory Visit
Admission: RE | Admit: 2016-04-09 | Discharge: 2016-04-09 | Disposition: A | Discharge status: Home / Self Care | Payer: Managed Care, Other (non HMO) | Source: Ambulatory Visit | Attending: Obstetrics and Gynecology | Admitting: Obstetrics and Gynecology

## 2016-04-09 DIAGNOSIS — Z1231 Encounter for screening mammogram for malignant neoplasm of breast: Secondary | ICD-10-CM

## 2016-04-12 ENCOUNTER — Other Ambulatory Visit: Payer: Self-pay | Admitting: Obstetrics and Gynecology

## 2016-04-12 DIAGNOSIS — R928 Other abnormal and inconclusive findings on diagnostic imaging of breast: Secondary | ICD-10-CM

## 2016-04-17 ENCOUNTER — Other Ambulatory Visit (HOSPITAL_COMMUNITY): Payer: Self-pay | Admitting: *Deleted

## 2016-04-17 DIAGNOSIS — R928 Other abnormal and inconclusive findings on diagnostic imaging of breast: Secondary | ICD-10-CM

## 2016-05-10 ENCOUNTER — Ambulatory Visit (HOSPITAL_COMMUNITY)
Admission: RE | Admit: 2016-05-10 | Discharge: 2016-05-10 | Disposition: A | Discharge status: Home / Self Care | Payer: Self-pay | Source: Ambulatory Visit | Attending: Obstetrics and Gynecology | Admitting: Obstetrics and Gynecology

## 2016-05-10 ENCOUNTER — Ambulatory Visit
Admission: RE | Admit: 2016-05-10 | Discharge: 2016-05-10 | Disposition: A | Discharge status: Home / Self Care | Payer: No Typology Code available for payment source | Source: Ambulatory Visit | Attending: Obstetrics and Gynecology | Admitting: Obstetrics and Gynecology

## 2016-05-10 ENCOUNTER — Encounter (HOSPITAL_COMMUNITY): Payer: Self-pay

## 2016-05-10 VITALS — BP 180/84 | Temp 98.3°F | Ht 61.0 in | Wt 190.0 lb

## 2016-05-10 DIAGNOSIS — Z1239 Encounter for other screening for malignant neoplasm of breast: Secondary | ICD-10-CM

## 2016-05-10 DIAGNOSIS — R928 Other abnormal and inconclusive findings on diagnostic imaging of breast: Secondary | ICD-10-CM

## 2016-05-10 HISTORY — DX: Type 2 diabetes mellitus without complications: E11.9

## 2016-05-10 NOTE — Progress Notes (Signed)
[facility] due to recommending additional imaging of her right breast. Screening mammogram completed 04/09/2016.  Pap Smear:  Pap smear not completed today. [facility] August 2017 [facility] and normal per patient. Per patient has a history of an abnormal Pap smear around 20-25 years ago that cryotherapy and a CKC were completed. Patient stated she has a history of a colposcopy in 2016 from an abnormal observation but she thinks the Pap smear was normal. No Pap smear results are in EPIC.  Physical exam: Breasts Breasts symmetrical. No skin abnormalities bilateral breasts. No nipple retraction bilateral breasts. No nipple discharge bilateral breasts. No lymphadenopathy. No lumps palpated bilateral breasts. No complaints of pain or tenderness on exam. [facility] for right breast diagnostic mammogram and possible breast ultrasound per recommendation. Appointment scheduled for Thursday, May 10, 2016 at 1350.        Pelvic/Bimanual No Pap smear completed today since last Pap smear was in August 2017 per patient. Pap smear not indicated per [facility] guidelines.   Smoking History: Patient has never smoked.  Patient Navigation: Patient education provided. Access to services provided for patient through [facility] program.   Colorectal Cancer Screening: Per patient had a colonoscopy completed five years ago. No complaints today.

## 2016-05-10 NOTE — Patient Instructions (Signed)
Explained breast self awareness with Jacqueline Wilkerson. Patient did not need a Pap smear today due to last Pap smear was in August 2017 per patient. Let her know her next Pap smear will be due in one year due to her history of an abnormal finding on her Pap smear in 2016. Explained to patient will need at least three Pap smears in a row before can space out greater than one year. [facility] breast diagnostic mamm[facility] and possible breast ultrasound per recommendation. Appointment scheduled for Thursday, May 10, 2016 at 1350. Jacqueline Wilkerson verbalized understanding.  Jacqueline Wilkerson, Kathaleen Maserhristine Poll, RN 3:17 PM

## 2016-05-11 ENCOUNTER — Encounter (HOSPITAL_COMMUNITY): Payer: Self-pay | Admitting: *Deleted

## 2016-05-30 DIAGNOSIS — G4700 Insomnia, unspecified: Secondary | ICD-10-CM | POA: Diagnosis not present

## 2016-05-30 DIAGNOSIS — E890 Postprocedural hypothyroidism: Secondary | ICD-10-CM | POA: Diagnosis not present

## 2016-05-30 DIAGNOSIS — E119 Type 2 diabetes mellitus without complications: Secondary | ICD-10-CM | POA: Diagnosis not present

## 2016-05-30 DIAGNOSIS — I10 Essential (primary) hypertension: Secondary | ICD-10-CM | POA: Diagnosis not present

## 2016-08-31 DIAGNOSIS — E1165 Type 2 diabetes mellitus with hyperglycemia: Secondary | ICD-10-CM | POA: Diagnosis not present

## 2016-10-10 DIAGNOSIS — H5210 Myopia, unspecified eye: Secondary | ICD-10-CM | POA: Diagnosis not present

## 2016-10-10 DIAGNOSIS — E119 Type 2 diabetes mellitus without complications: Secondary | ICD-10-CM | POA: Diagnosis not present

## 2016-11-28 DIAGNOSIS — G4700 Insomnia, unspecified: Secondary | ICD-10-CM | POA: Diagnosis not present

## 2016-11-28 DIAGNOSIS — E119 Type 2 diabetes mellitus without complications: Secondary | ICD-10-CM | POA: Diagnosis not present

## 2016-11-28 DIAGNOSIS — E890 Postprocedural hypothyroidism: Secondary | ICD-10-CM | POA: Diagnosis not present

## 2016-11-28 DIAGNOSIS — I10 Essential (primary) hypertension: Secondary | ICD-10-CM | POA: Diagnosis not present

## 2017-01-16 DIAGNOSIS — R87612 Low grade squamous intraepithelial lesion on cytologic smear of cervix (LGSIL): Secondary | ICD-10-CM | POA: Diagnosis not present

## 2017-01-16 DIAGNOSIS — Z6835 Body mass index (BMI) 35.0-35.9, adult: Secondary | ICD-10-CM | POA: Diagnosis not present

## 2017-01-16 DIAGNOSIS — Z01419 Encounter for gynecological examination (general) (routine) without abnormal findings: Secondary | ICD-10-CM | POA: Diagnosis not present

## 2017-01-16 DIAGNOSIS — Z1151 Encounter for screening for human papillomavirus (HPV): Secondary | ICD-10-CM | POA: Diagnosis not present

## 2017-03-01 DIAGNOSIS — Z23 Encounter for immunization: Secondary | ICD-10-CM | POA: Diagnosis not present

## 2017-04-16 DIAGNOSIS — L989 Disorder of the skin and subcutaneous tissue, unspecified: Secondary | ICD-10-CM | POA: Diagnosis not present

## 2017-04-16 DIAGNOSIS — L918 Other hypertrophic disorders of the skin: Secondary | ICD-10-CM | POA: Diagnosis not present

## 2017-04-22 DIAGNOSIS — I10 Essential (primary) hypertension: Secondary | ICD-10-CM | POA: Diagnosis not present

## 2017-04-22 DIAGNOSIS — S300XXA Contusion of lower back and pelvis, initial encounter: Secondary | ICD-10-CM | POA: Diagnosis not present

## 2017-04-22 DIAGNOSIS — I998 Other disorder of circulatory system: Secondary | ICD-10-CM | POA: Diagnosis not present

## 2017-04-29 DIAGNOSIS — L821 Other seborrheic keratosis: Secondary | ICD-10-CM | POA: Diagnosis not present

## 2017-05-10 DIAGNOSIS — Z23 Encounter for immunization: Secondary | ICD-10-CM | POA: Diagnosis not present

## 2017-05-23 ENCOUNTER — Other Ambulatory Visit: Payer: Self-pay | Admitting: Obstetrics and Gynecology

## 2017-05-23 DIAGNOSIS — Z139 Encounter for screening, unspecified: Secondary | ICD-10-CM

## 2017-05-31 DIAGNOSIS — G4700 Insomnia, unspecified: Secondary | ICD-10-CM | POA: Diagnosis not present

## 2017-05-31 DIAGNOSIS — E119 Type 2 diabetes mellitus without complications: Secondary | ICD-10-CM | POA: Diagnosis not present

## 2017-05-31 DIAGNOSIS — I10 Essential (primary) hypertension: Secondary | ICD-10-CM | POA: Diagnosis not present

## 2017-05-31 DIAGNOSIS — E890 Postprocedural hypothyroidism: Secondary | ICD-10-CM | POA: Diagnosis not present

## 2017-06-19 ENCOUNTER — Ambulatory Visit
Admission: RE | Admit: 2017-06-19 | Discharge: 2017-06-19 | Disposition: A | Discharge status: Home / Self Care | Payer: BLUE CROSS/BLUE SHIELD | Source: Ambulatory Visit | Attending: Obstetrics and Gynecology | Admitting: Obstetrics and Gynecology

## 2017-06-19 DIAGNOSIS — Z1231 Encounter for screening mammogram for malignant neoplasm of breast: Secondary | ICD-10-CM | POA: Diagnosis not present

## 2017-06-19 DIAGNOSIS — Z139 Encounter for screening, unspecified: Secondary | ICD-10-CM

## 2017-08-13 DIAGNOSIS — J00 Acute nasopharyngitis [common cold]: Secondary | ICD-10-CM | POA: Diagnosis not present

## 2017-12-06 DIAGNOSIS — E7800 Pure hypercholesterolemia, unspecified: Secondary | ICD-10-CM | POA: Diagnosis not present

## 2017-12-06 DIAGNOSIS — E890 Postprocedural hypothyroidism: Secondary | ICD-10-CM | POA: Diagnosis not present

## 2017-12-06 DIAGNOSIS — I10 Essential (primary) hypertension: Secondary | ICD-10-CM | POA: Diagnosis not present

## 2017-12-06 DIAGNOSIS — E1169 Type 2 diabetes mellitus with other specified complication: Secondary | ICD-10-CM | POA: Diagnosis not present

## 2017-12-06 DIAGNOSIS — G4700 Insomnia, unspecified: Secondary | ICD-10-CM | POA: Diagnosis not present

## 2018-01-15 DIAGNOSIS — H5210 Myopia, unspecified eye: Secondary | ICD-10-CM | POA: Diagnosis not present

## 2018-01-15 DIAGNOSIS — E119 Type 2 diabetes mellitus without complications: Secondary | ICD-10-CM | POA: Diagnosis not present

## 2018-01-16 DIAGNOSIS — G5762 Lesion of plantar nerve, left lower limb: Secondary | ICD-10-CM | POA: Diagnosis not present

## 2018-01-16 DIAGNOSIS — M7752 Other enthesopathy of left foot: Secondary | ICD-10-CM | POA: Diagnosis not present

## 2018-01-23 DIAGNOSIS — M7751 Other enthesopathy of right foot: Secondary | ICD-10-CM | POA: Diagnosis not present

## 2018-01-23 DIAGNOSIS — G5761 Lesion of plantar nerve, right lower limb: Secondary | ICD-10-CM | POA: Diagnosis not present

## 2018-05-15 ENCOUNTER — Other Ambulatory Visit: Payer: Self-pay | Admitting: Obstetrics and Gynecology

## 2018-05-15 DIAGNOSIS — Z1231 Encounter for screening mammogram for malignant neoplasm of breast: Secondary | ICD-10-CM

## 2018-06-05 DIAGNOSIS — I10 Essential (primary) hypertension: Secondary | ICD-10-CM | POA: Diagnosis not present

## 2018-06-05 DIAGNOSIS — E1169 Type 2 diabetes mellitus with other specified complication: Secondary | ICD-10-CM | POA: Diagnosis not present

## 2018-06-05 DIAGNOSIS — E890 Postprocedural hypothyroidism: Secondary | ICD-10-CM | POA: Diagnosis not present

## 2018-06-05 DIAGNOSIS — E7800 Pure hypercholesterolemia, unspecified: Secondary | ICD-10-CM | POA: Diagnosis not present

## 2018-06-05 DIAGNOSIS — G4700 Insomnia, unspecified: Secondary | ICD-10-CM | POA: Diagnosis not present

## 2018-06-26 ENCOUNTER — Ambulatory Visit
Admission: RE | Admit: 2018-06-26 | Discharge: 2018-06-26 | Disposition: A | Discharge status: Home / Self Care | Payer: BLUE CROSS/BLUE SHIELD | Source: Ambulatory Visit | Attending: Obstetrics and Gynecology | Admitting: Obstetrics and Gynecology

## 2018-06-26 DIAGNOSIS — Z1231 Encounter for screening mammogram for malignant neoplasm of breast: Secondary | ICD-10-CM | POA: Diagnosis not present

## 2018-06-29 DIAGNOSIS — J0110 Acute frontal sinusitis, unspecified: Secondary | ICD-10-CM | POA: Diagnosis not present

## 2018-11-17 DIAGNOSIS — E039 Hypothyroidism, unspecified: Secondary | ICD-10-CM | POA: Diagnosis not present

## 2018-11-17 DIAGNOSIS — F5101 Primary insomnia: Secondary | ICD-10-CM | POA: Diagnosis not present

## 2018-11-17 DIAGNOSIS — K219 Gastro-esophageal reflux disease without esophagitis: Secondary | ICD-10-CM | POA: Diagnosis not present

## 2018-11-17 DIAGNOSIS — E119 Type 2 diabetes mellitus without complications: Secondary | ICD-10-CM | POA: Diagnosis not present

## 2018-12-22 DIAGNOSIS — L259 Unspecified contact dermatitis, unspecified cause: Secondary | ICD-10-CM | POA: Diagnosis not present

## 2018-12-30 DIAGNOSIS — F5101 Primary insomnia: Secondary | ICD-10-CM | POA: Diagnosis not present

## 2018-12-30 DIAGNOSIS — R030 Elevated blood-pressure reading, without diagnosis of hypertension: Secondary | ICD-10-CM | POA: Diagnosis not present

## 2018-12-30 DIAGNOSIS — E039 Hypothyroidism, unspecified: Secondary | ICD-10-CM | POA: Diagnosis not present

## 2019-02-03 DIAGNOSIS — E119 Type 2 diabetes mellitus without complications: Secondary | ICD-10-CM | POA: Diagnosis not present

## 2019-02-18 DIAGNOSIS — H5210 Myopia, unspecified eye: Secondary | ICD-10-CM | POA: Diagnosis not present

## 2019-03-05 DIAGNOSIS — F17200 Nicotine dependence, unspecified, uncomplicated: Secondary | ICD-10-CM | POA: Diagnosis not present

## 2019-03-05 DIAGNOSIS — E119 Type 2 diabetes mellitus without complications: Secondary | ICD-10-CM | POA: Diagnosis not present

## 2019-03-05 DIAGNOSIS — Z23 Encounter for immunization: Secondary | ICD-10-CM | POA: Diagnosis not present

## 2019-03-05 DIAGNOSIS — E039 Hypothyroidism, unspecified: Secondary | ICD-10-CM | POA: Diagnosis not present

## 2019-03-05 DIAGNOSIS — I10 Essential (primary) hypertension: Secondary | ICD-10-CM | POA: Diagnosis not present

## 2019-05-13 ENCOUNTER — Other Ambulatory Visit: Payer: Self-pay | Admitting: Obstetrics and Gynecology

## 2019-05-13 DIAGNOSIS — Z1231 Encounter for screening mammogram for malignant neoplasm of breast: Secondary | ICD-10-CM

## 2019-05-22 DIAGNOSIS — E039 Hypothyroidism, unspecified: Secondary | ICD-10-CM | POA: Diagnosis not present

## 2019-05-22 DIAGNOSIS — J301 Allergic rhinitis due to pollen: Secondary | ICD-10-CM | POA: Diagnosis not present

## 2019-05-22 DIAGNOSIS — H6980 Other specified disorders of Eustachian tube, unspecified ear: Secondary | ICD-10-CM | POA: Diagnosis not present

## 2019-05-22 DIAGNOSIS — E785 Hyperlipidemia, unspecified: Secondary | ICD-10-CM | POA: Diagnosis not present

## 2019-05-27 DIAGNOSIS — J0111 Acute recurrent frontal sinusitis: Secondary | ICD-10-CM | POA: Diagnosis not present

## 2019-06-29 IMAGING — MG 2D DIGITAL SCREENING BILATERAL MAMMOGRAM WITH 3D TOMO WITH CAD
8 of 12 series · 8 of 28 positions shown · non-contrast
Comparison: Previous exam(s).

CLINICAL DATA: Screening.

EXAM:
2D DIGITAL SCREENING BILATERAL MAMMOGRAM WITH 3D TOMO WITH CAD

[R CC synth-2D]
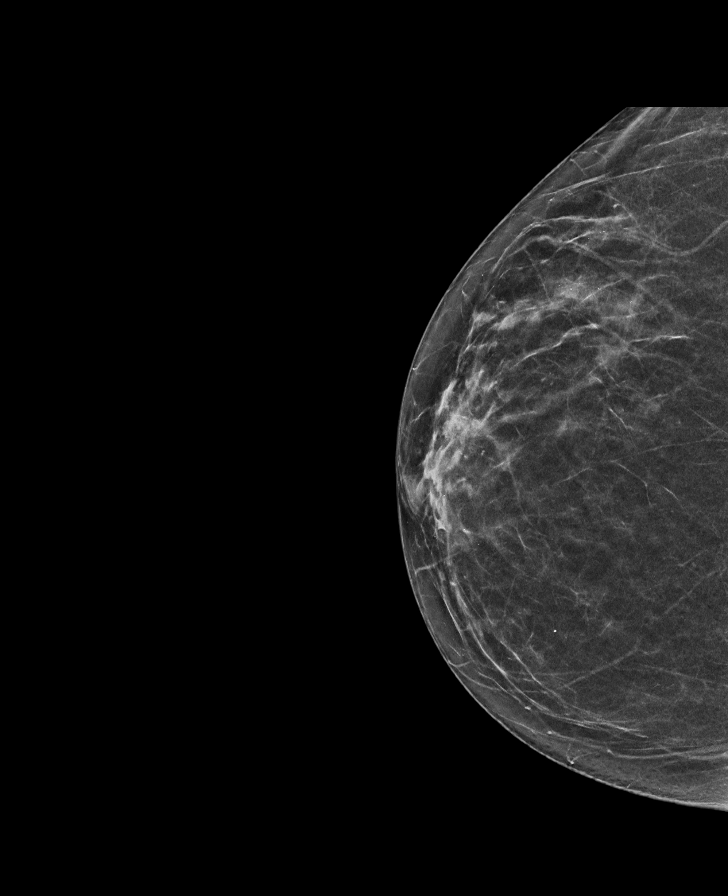

[L CC]
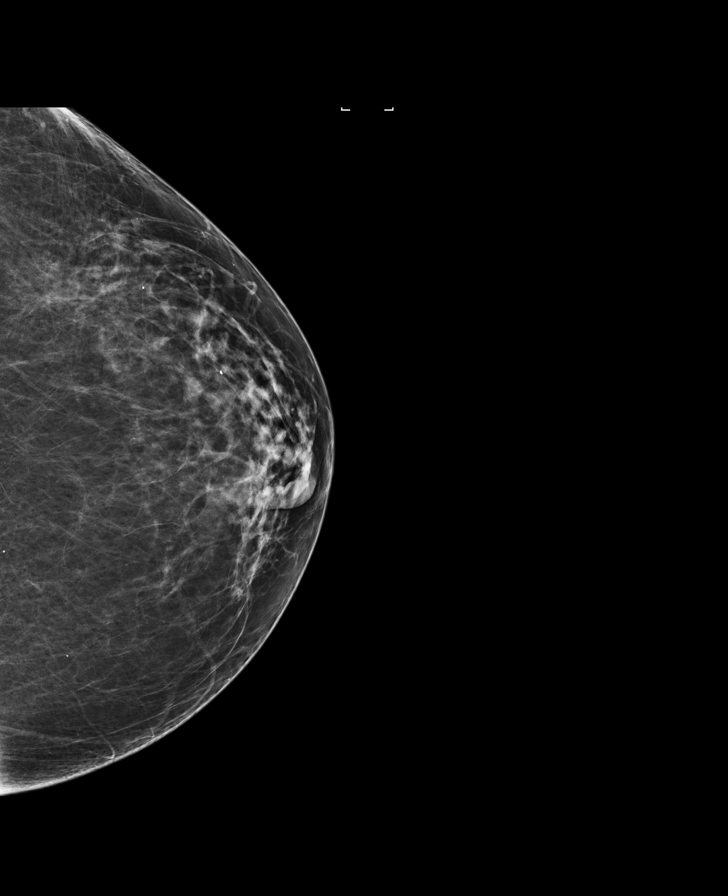

[L CC synth-2D]
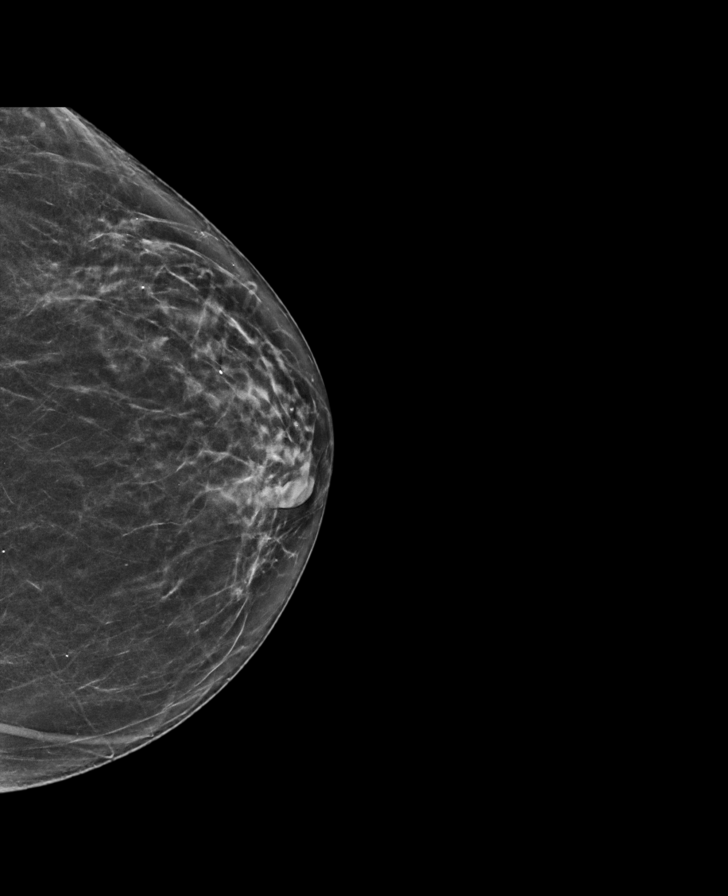

[L MLO]
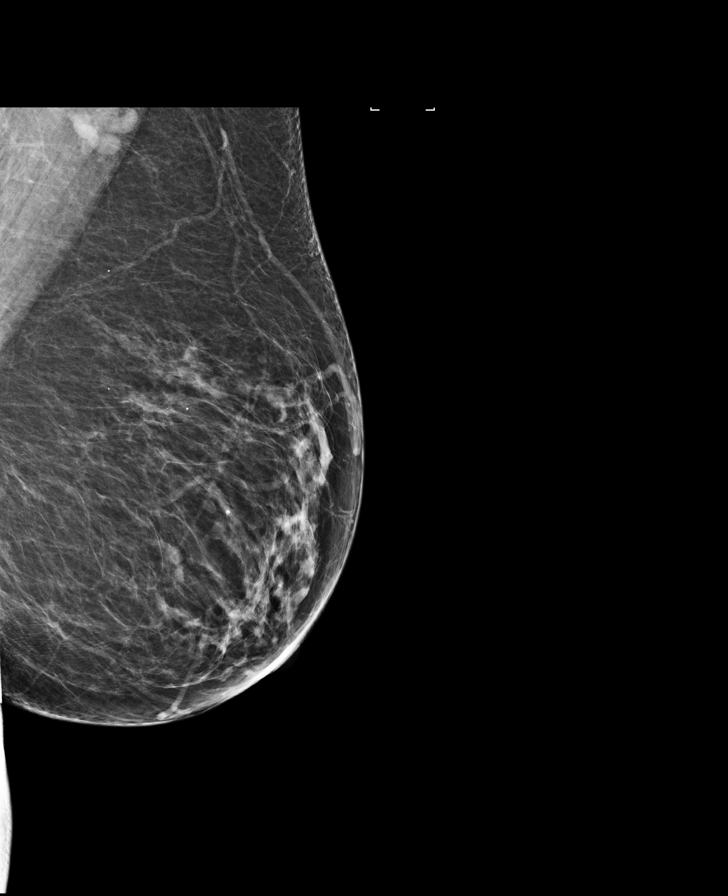

[R MLO]
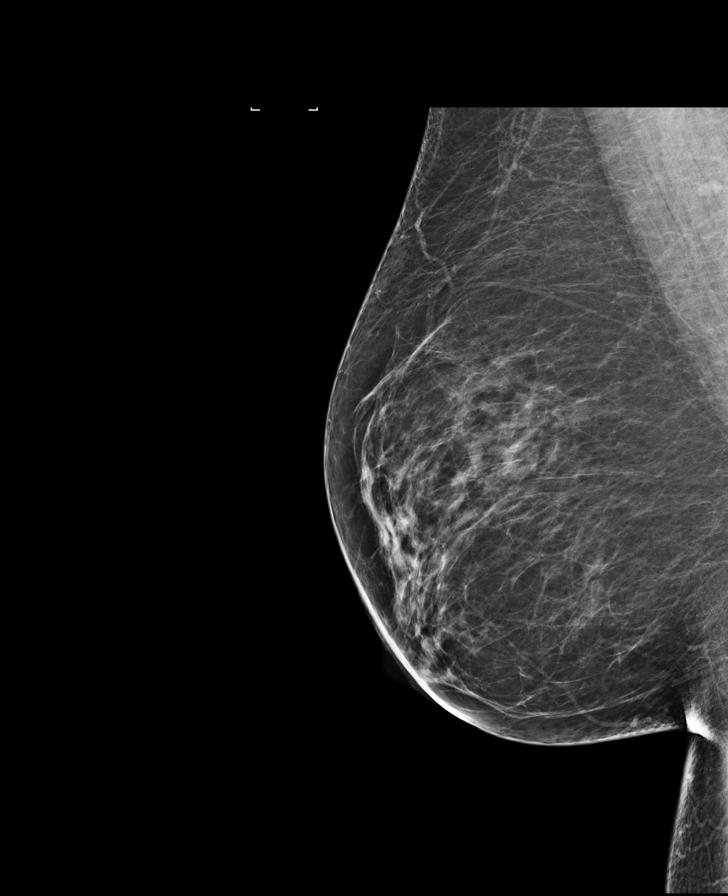

[R MLO synth-2D]
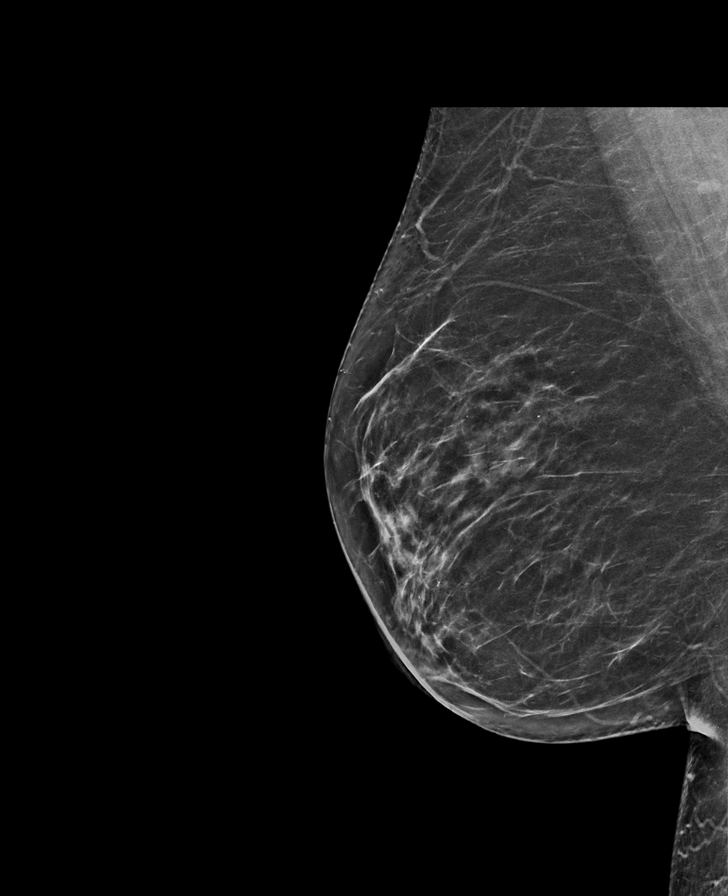

[R CC]
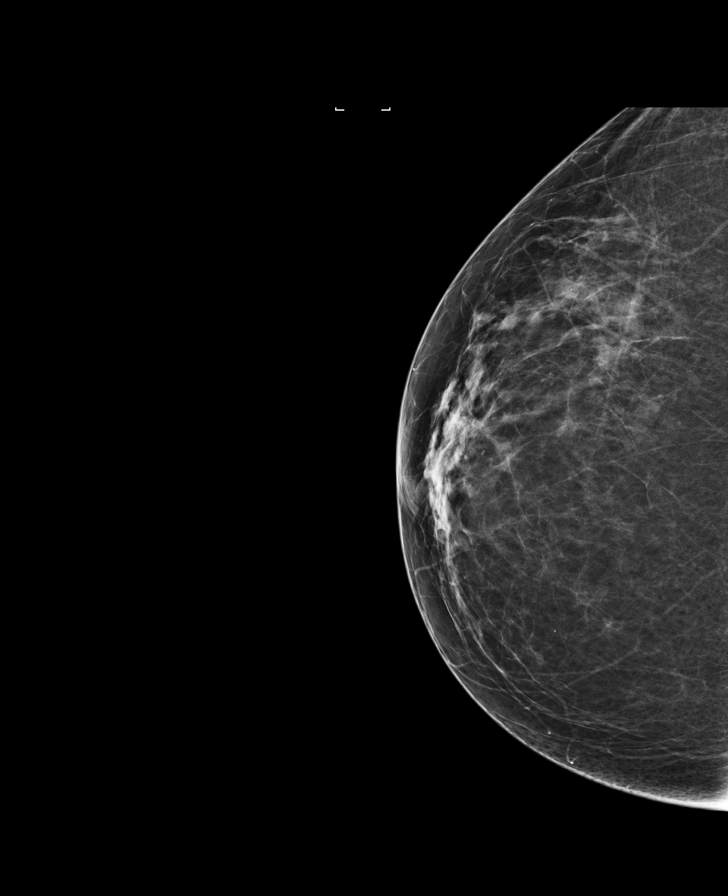

[L MLO synth-2D]
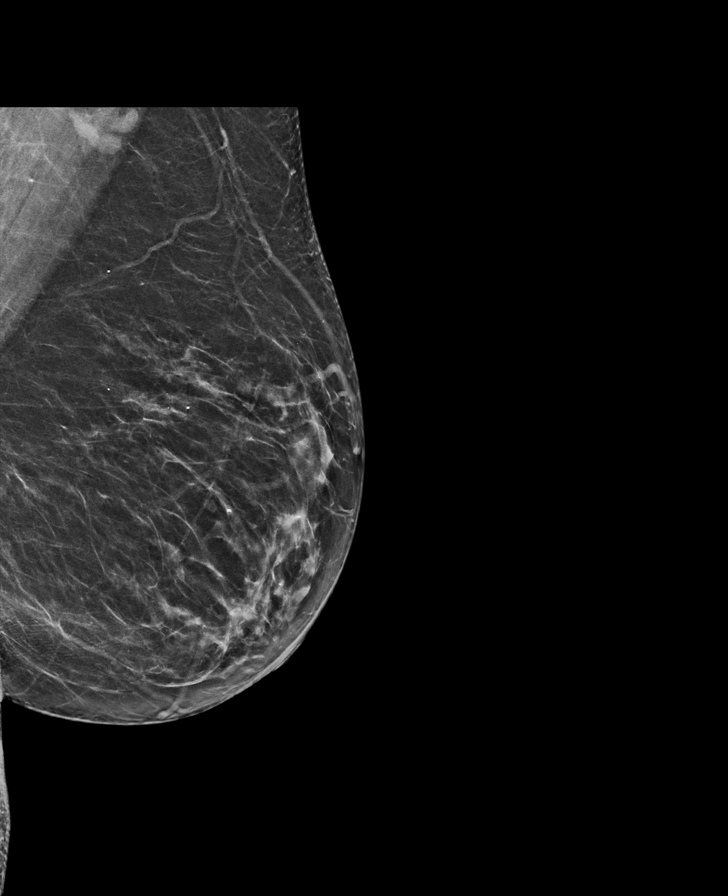

[8 of 28 positions shown; findings below may reference images not displayed]

ACR Breast Density Category b: There are scattered areas of
fibroglandular density.
FINDINGS: There are no findings suspicious for malignancy. Images were
processed with CAD.
IMPRESSION: No mammographic evidence of malignancy. A result letter of this
screening mammogram will be mailed directly to the patient.

RECOMMENDATION:
Screening mammogram in one year. (Code:[id number])

BI-RADS CATEGORY  1: Negative.

## 2019-07-03 ENCOUNTER — Ambulatory Visit
Admission: RE | Admit: 2019-07-03 | Discharge: 2019-07-03 | Disposition: A | Discharge status: Home / Self Care | Payer: BC Managed Care – PPO | Source: Ambulatory Visit | Attending: Obstetrics and Gynecology | Admitting: Obstetrics and Gynecology

## 2019-07-03 ENCOUNTER — Other Ambulatory Visit: Payer: Self-pay

## 2019-07-03 DIAGNOSIS — Z1231 Encounter for screening mammogram for malignant neoplasm of breast: Secondary | ICD-10-CM

## 2020-08-19 ENCOUNTER — Other Ambulatory Visit: Payer: Self-pay | Admitting: Obstetrics and Gynecology

## 2020-08-19 DIAGNOSIS — Z1231 Encounter for screening mammogram for malignant neoplasm of breast: Secondary | ICD-10-CM

## 2020-10-12 ENCOUNTER — Ambulatory Visit
Admission: RE | Admit: 2020-10-12 | Discharge: 2020-10-12 | Disposition: A | Discharge status: Home / Self Care | Payer: BC Managed Care – PPO | Source: Ambulatory Visit | Attending: Obstetrics and Gynecology | Admitting: Obstetrics and Gynecology

## 2020-10-12 ENCOUNTER — Other Ambulatory Visit: Payer: Self-pay

## 2020-10-12 DIAGNOSIS — Z1231 Encounter for screening mammogram for malignant neoplasm of breast: Secondary | ICD-10-CM

## 2021-08-29 ENCOUNTER — Other Ambulatory Visit: Payer: Self-pay | Admitting: Obstetrics and Gynecology

## 2021-08-29 DIAGNOSIS — Z1231 Encounter for screening mammogram for malignant neoplasm of breast: Secondary | ICD-10-CM

## 2021-10-13 ENCOUNTER — Ambulatory Visit
Admission: RE | Admit: 2021-10-13 | Discharge: 2021-10-13 | Disposition: A | Discharge status: Home / Self Care | Payer: BC Managed Care – PPO | Source: Ambulatory Visit | Attending: Obstetrics and Gynecology | Admitting: Obstetrics and Gynecology

## 2021-10-13 DIAGNOSIS — Z1231 Encounter for screening mammogram for malignant neoplasm of breast: Secondary | ICD-10-CM

## 2022-11-23 ENCOUNTER — Other Ambulatory Visit: Payer: Self-pay | Admitting: Obstetrics and Gynecology

## 2022-11-23 DIAGNOSIS — Z1231 Encounter for screening mammogram for malignant neoplasm of breast: Secondary | ICD-10-CM

## 2022-12-19 ENCOUNTER — Ambulatory Visit
Admission: RE | Admit: 2022-12-19 | Discharge: 2022-12-19 | Disposition: A | Discharge status: Home / Self Care | Payer: BC Managed Care – PPO | Source: Ambulatory Visit | Attending: Obstetrics and Gynecology | Admitting: Obstetrics and Gynecology

## 2022-12-19 DIAGNOSIS — Z1231 Encounter for screening mammogram for malignant neoplasm of breast: Secondary | ICD-10-CM
